# Patient Record
Sex: Female | Born: 1960 | Race: White | Smoking: Former smoker
Health system: Southern US, Community
[De-identification: ages and names within clinical notes are randomized; demographics above are authoritative.]

---

## 2015-05-29 ENCOUNTER — Ambulatory Visit (INDEPENDENT_AMBULATORY_CARE_PROVIDER_SITE_OTHER): Payer: BLUE CROSS/BLUE SHIELD | Admitting: Neurology

## 2015-05-29 ENCOUNTER — Encounter: Payer: Self-pay | Admitting: Neurology

## 2015-05-29 VITALS — BP 138/96 | HR 68 | Resp 16 | Ht 59.0 in | Wt 206.0 lb

## 2015-05-29 DIAGNOSIS — H4710 Unspecified papilledema: Secondary | ICD-10-CM | POA: Diagnosis not present

## 2015-05-29 DIAGNOSIS — R930 Abnormal findings on diagnostic imaging of skull and head, not elsewhere classified: Secondary | ICD-10-CM | POA: Diagnosis not present

## 2015-05-29 DIAGNOSIS — R9089 Other abnormal findings on diagnostic imaging of central nervous system: Secondary | ICD-10-CM

## 2015-05-29 DIAGNOSIS — G43009 Migraine without aura, not intractable, without status migrainosus: Secondary | ICD-10-CM | POA: Diagnosis not present

## 2015-05-29 MED ORDER — TOPIRAMATE 100 MG PO TABS: ORAL_TABLET | ORAL | Status: DC

## 2015-05-29 MED ORDER — SUMATRIPTAN SUCCINATE 100 MG PO TABS: 100.0000 mg | ORAL_TABLET | Freq: Once | ORAL | Status: DC | PRN

## 2015-05-29 NOTE — Patient Instructions (Signed)
Start topiramate one half pill nightly for one week then go up to 1 pill nightly. He can then stop the amitriptyline.  Somebody will call you to schedule the lumbar puncture which should be performed in the next couple weeks.

## 2015-05-29 NOTE — Progress Notes (Signed)
[facility]  PATIENT: Cheryl Gomez DOB: 27-Jan-1961  REFERRING DOCTOR OR PCP:  Leretha Pol, NP (phone [phone]); Dr. Bradd Burner (fax [phone]) SOURCE: patient, records from Ms. Harris, Dr. Bradd Burner , and MRI report  _________________________________   HISTORICAL  CHIEF COMPLAINT:  Chief Complaint  Patient presents with  . Abnormal MRI    Cheryl Gomez is here for eval of abnormal mri brain.  Sts. she went to the opthalmologist for new glasses last month, and the opthalmologist "saw somethimg" and ordered mri brain.  Sts. she was told mri brain showed changes suspicious for MS.  Sts. she has always had h/a's--has been on Amitriptyline for about a yr.  Sts. in the last 1-2 mos, h/a's have been more frequent, more severe./fim    HISTORY OF PRESENT ILLNESS:  I had the pleasure of seeing your patient, Cheryl Gomez, at [facility] for neurologic consultation regarding her optic nerve swelling and abnormal MRI.  She is a 55 year old woman with a history of migraine headaches who [facility] and migraine headaches. On examination, visual acuity was 20/25 OD and 20/20 OS. There was mild cataracts both eyes. She had optic nerve swelling bilaterally. She was referred for an MRI of the brain with gadolinium. The report states that there were 3-4 white matter foci within impression "minimal nonspecific white matter disease as described. The differential diagnosis includes microangiopathic disease, migraine headache, vasculitis, various infectious and post infectious etiologies, and demyelination (MS). There is no involvement of the corpus callosum to suggest the latter."  She has had migraine headaches times many years but they have worsened over the last year or so. When she gets a migraine headache it is usually bilateral with pain over the eyes and in the back of the head. Nausea with the migraines. Moving the  head worsens the headache. She does not get any vomiting. She reports photophobia and phonophobia. When she gets a migraine, she usually will take OTC Motrin. However, it does not always help. In the past, she took Imitrex with better benefit., She is on atenolol and amitriptyline.  The atenolol was for blood pressure and the amitriptyline was for migraine prophylaxis.  She has had problems with insomnia. At night, she often has trouble quieting her mind enough to go to bed. The amitriptyline helped a little bit but not as much recently.   She denies restless leg syndrome most nights but sometimes has right leg cramps.      She denies any difficulties with her gait. There is no weakness or numbness. She does have urinary frequency and urgency. She has nocturia up to 4 times a night. Amitriptyline did not help the nocturia.     She denies depression or anxiety.  REVIEW OF SYSTEMS: Constitutional: No fevers, chills, sweats, or change in appetite Eyes: No visual changes, double vision, eye pain Ear, nose and throat: No hearing loss, ear pain, nasal congestion, sore throat Cardiovascular: No chest pain, palpitations Respiratory: No shortness of breath at rest or with exertion.   No wheezes GastrointestinaI: No nausea, vomiting, diarrhea, abdominal pain, fecal incontinence Genitourinary: She reports urinary frequency and urgency and nocturia.. Musculoskeletal: Some neck pain Integumentary: No rash, pruritus, skin lesions Neurological: as above Psychiatric: No depression at this time.  No anxiety Endocrine: No palpitations, diaphoresis, change in appetite, change in weigh or increased thirst Hematologic/Lymphatic: No anemia, purpura, petechiae. Allergic/Immunologic: No itchy/runny eyes, nasal congestion, recent allergic reactions, rashes  ALLERGIES:  No Known Allergies  HOME MEDICATIONS:  Current outpatient prescriptions:  .  amitriptyline (ELAVIL) 25 MG tablet, , Disp: , Rfl:  .   atenolol (TENORMIN) 25 MG tablet, , Disp: , Rfl:  .  simvastatin (ZOCOR) 20 MG tablet, Take 20 mg by mouth daily., Disp: , Rfl:   PAST MEDICAL HISTORY: No past medical history on file.  PAST SURGICAL HISTORY: No past surgical history on file.  FAMILY HISTORY: No family history on file.  SOCIAL HISTORY:  Social History   Social History  . Marital Status: Unknown    Spouse Name: N/A  . Number of Children: N/A  . Years of Education: N/A   Occupational History  . Not on file.   Social History Main Topics  . Smoking status: Former Games developermoker  . Smokeless tobacco: Not on file  . Alcohol Use: 0.0 oz/week    0 Standard drinks or equivalent per week     Comment: social  . Drug Use: No  . Sexual Activity: Not on file   Other Topics Concern  . Not on file   Social History Narrative  . No narrative on file     PHYSICAL EXAM  Filed Vitals:   05/29/15 1511  BP: 138/96  Pulse: 68  Resp: 16  Height: 4\' 11"  (1.499 m)  Weight: 206 lb (93.441 kg)    Body mass index is 41.58 kg/(m^2).   General: The patient is well-developed and well-nourished and in no acute distress  Eyes:  Funduscopic exam shows mild bilateral papilledema.  Venous pulsations are not seen.   Arteries appear normal.  There are no hemorrhages  Neck: The neck is supple, no carotid bruits are noted.  The neck is slightly tender (occiput)  Cardiovascular: The heart has a regular rate and rhythm with a normal S1 and S2. There were no murmurs, gallops or rubs.    Skin: Extremities are without significant edema.  Musculoskeletal:  Back is nontender  Neurologic Exam  Mental status: The patient is alert and oriented x 3 at the time of the examination. The patient has apparent normal recent and remote memory, with an apparently normal attention span and concentration ability.   Speech is normal.  Cranial nerves: Extraocular movements are full. Pupils are equal, round, and reactive to light and accomodation.   Visual fields are full.  Facial symmetry is present. There is good facial sensation to soft touch bilaterally.Facial strength is normal.  Trapezius and sternocleidomastoid strength is normal. No dysarthria is noted.  The tongue is midline, and the patient has symmetric elevation of the soft palate. No obvious hearing deficits are noted.  Motor:  Muscle bulk is normal.   Tone is normal. Strength is  5 / 5 in all 4 extremities.   Sensory: Sensory testing is intact to pinprick, soft touch and vibration sensation in all 4 extremities.  Coordination: Cerebellar testing reveals good finger-nose-finger and heel-to-shin bilaterally.  Gait and station: Station is normal.   Gait is normal. Tandem gait is mildly wide. Romberg is negative.   Reflexes: Deep tendon reflexes are symmetric and normal bilaterally.   Plantar responses are flexor.    DIAGNOSTIC DATA (LABS, IMAGING, TESTING) - I reviewed patient records, labs, notes, testing and imaging myself where available.      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 I discussed with her that the likelihood of multiple sclerosis is very mild. By the imaging report, she has only a few nonspecific foci in the brain that is not an uncommon finding by the time summary reaches their 35s, especially with a history of hypertension and migraine.   MS lesions usually look different. Therefore, I am requesting the actual MRI images and we will call her back with an update after I have a chance to review them.  I am concerned about her mild papilledema. She does also report some visual changes and has the headaches that have worsened the past year. She reports gaining some weight over the past couple of years. Most likely, she has idiopathic intracranial  hypertension (pseudotumor cerebri). I will have her get a lumbar puncture to measure the opening pressure of the CSF. While we are getting CSF, we will also do oligoclonal bands and IgG index to assess for the possibility of MS.  She will get 3 pills of alprazolam 0.5 mg to help her through the procedure.   I will switch her from amitriptyline to topiramate as it can reduce the CSF pressure and might help her migraines before. I will also write for Imitrex for breakthrough migraine. I also would like her to get visual field testing and she can hopefully get that at her eye care center.  She will return to see me in 2 months or sooner if there are new worsening neurologic symptoms.  Thank you for asking me to see Cheryl Gomez for a neurologic consultation. Please let me know if I can be of further assistance with her or the patient's the future.   Richard A. Epimenio Foot, MD, PhD 05/29/2015, 3:14 PM Certified in Neurology, Clinical Neurophysiology, Sleep Medicine, Pain Medicine and Neuroimaging  [facility] [address] [place], [place] [address] [phone]

## 2015-06-04 ENCOUNTER — Telehealth: Payer: Self-pay | Admitting: *Deleted

## 2015-06-04 NOTE — Telephone Encounter (Signed)
[facility] requesting MRI brain/ spine on a CD.

## 2015-06-06 ENCOUNTER — Other Ambulatory Visit: Payer: Self-pay | Admitting: Internal Medicine

## 2015-06-12 ENCOUNTER — Telehealth: Payer: Self-pay | Admitting: Neurology

## 2015-06-12 NOTE — Telephone Encounter (Signed)
I have spoken with Angeleen this morning and per RAS, advised that he has reviewed her mri--that there are some spots in her brain that are likely due to a combination of age, migraines, htn; they do not look like typical ms lesions.  She verbalized understanding of same.  We will call her with results of lp labs a few days after lp/fim

## 2015-06-12 NOTE — Telephone Encounter (Signed)
LMTC./fim 

## 2015-06-12 NOTE — Telephone Encounter (Signed)
I personally reviewed her MRI images from 04/29/2015 she has a few small spots in the brain. Most likely, they are due to a combination of age, migraine and hypertension.   They do not have an appearance typical for MS.   A few days after the lumbar puncture, we will get back to her with those results.

## 2015-06-16 ENCOUNTER — Other Ambulatory Visit: Payer: Self-pay | Admitting: Neurology

## 2015-06-16 ENCOUNTER — Ambulatory Visit
Admission: RE | Admit: 2015-06-16 | Discharge: 2015-06-16 | Disposition: A | Discharge status: Home / Self Care | Payer: BLUE CROSS/BLUE SHIELD | Source: Ambulatory Visit | Attending: Neurology | Admitting: Neurology

## 2015-06-16 DIAGNOSIS — H4710 Unspecified papilledema: Secondary | ICD-10-CM

## 2015-06-16 DIAGNOSIS — G43009 Migraine without aura, not intractable, without status migrainosus: Secondary | ICD-10-CM

## 2015-06-16 DIAGNOSIS — R9089 Other abnormal findings on diagnostic imaging of central nervous system: Secondary | ICD-10-CM

## 2015-06-16 LAB — CSF CELL COUNT WITH DIFFERENTIAL
Eosinophils, CSF: 0 % (ref 0–1)
LYMPHS CSF: 75 % (ref 40–80)
Monocyte/Macrophage: 25 % (ref 15–45)
RBC Count, CSF: 1 cu mm — ABNORMAL HIGH
SEGMENTED NEUTROPHILS-CSF: 0 % (ref 0–6)
TUBE #: 3
WBC CSF: 4 uL (ref 0–5)

## 2015-06-16 LAB — GLUCOSE, CSF: Glucose, CSF: 59 mg/dL (ref 43–76)

## 2015-06-16 NOTE — Discharge Instructions (Signed)

## 2015-06-16 NOTE — Progress Notes (Addendum)
1 SST tube drawn from right AC to go with spinal fluid. Site is unremarkable and pt tolerated procedure well.  Discharge instructions explained to pt and her friend JKL RN

## 2015-06-19 LAB — VDRL, CSF: VDRL Quant, CSF: NONREACTIVE

## 2015-06-19 LAB — CNS IGG SYNTHESIS RATE, CSF+BLOOD
ALBUMIN CSF: 18 mg/dL (ref 8.0–42.0)
ALBUMIN, SERUM(NEPH): 3.8 g/dL (ref 3.5–4.9)
IGG INDEX, CSF: 0.43 (ref <0.66)
IGG, SERUM: 1030 mg/dL (ref 694–1618)
IgG, CSF: 2.1 mg/dL (ref 0.8–7.7)
MS CNS IGG SYNTHESIS RATE: -4.3 mg/(24.h) (ref <=3.3)

## 2015-06-19 LAB — ANGIOTENSIN CONVERTING ENZYME, CSF: ACE, CSF: 11 U/L (ref <=15)

## 2015-06-22 LAB — OLIGOCLONAL BANDS, CSF + SERM

## 2015-06-23 ENCOUNTER — Telehealth: Payer: Self-pay | Admitting: *Deleted

## 2015-06-23 ENCOUNTER — Other Ambulatory Visit: Payer: Self-pay | Admitting: Neurology

## 2015-06-23 ENCOUNTER — Telehealth: Payer: Self-pay | Admitting: Neurology

## 2015-06-23 MED ORDER — ACETAZOLAMIDE ER 500 MG PO CP12: 500.0000 mg | ORAL_CAPSULE | Freq: Two times a day (BID) | ORAL | Status: DC

## 2015-06-23 NOTE — Telephone Encounter (Signed)
-----   Message from Richard A [name], MD sent at 06/20/2015  5:00 PM EST ----- Please let her know that the LP did show elevated pressure (c/w Pseudotumor cerebri)  please call in Diamox ER 500 mg po bid   #60   #11  The CSF results are not in EMR, please see if we can get ([facility]?) 

## 2015-06-23 NOTE — Telephone Encounter (Signed)
noted/fim 

## 2015-06-23 NOTE — Telephone Encounter (Signed)
I have spoken with Cheryl Gomez and per RAS, advised that LP showed elevated csf pressure, consistent with Pseudotumor Cerebri; that he would like her to start Diamox ER  po bid.  She verbalized understanding of same.  Rx. escribed to [facility] in [place].  LP results received from [facility] and in RAS box to be reviewed/fim

## 2015-06-23 NOTE — Telephone Encounter (Signed)
I have spoken with Krystalynn this morning and per RAS, advised that csf pressure was high; consistent with pseudotumor cerebri; that RAS would like for her to start Diamox ER  po bid.  She verbalized understanding of same, is agreeable with this plan.  Rx. escribed to [facility] in [place] per her request.  I have spoken with Rayfield Citizen at [facility] and requested LP lab results/fim

## 2015-06-23 NOTE — Telephone Encounter (Signed)
Cheryl Gomez with GI called said labs would be faxed from [facility] to [phone] per Faith request

## 2015-06-23 NOTE — Telephone Encounter (Signed)
LMTC./fim 

## 2015-06-23 NOTE — Telephone Encounter (Signed)
-----   Message from Asa Lente, MD sent at 06/20/2015  5:00 PM EST ----- Please let her know that the LP did show elevated pressure (c/w Pseudotumor cerebri)  please call in Diamox ER 500 mg po bid   #60   #11  The CSF results are not in EMR, please see if we can get ([facility]?)

## 2015-06-23 NOTE — Telephone Encounter (Signed)
LP results  Opening pressure was elevated at 27 cm Studies were otherwise normal: Cell count, glucose, VDRL, ACE were normal. IgG index was normal (0.43). There were no oligoclonal bands.

## 2015-07-28 ENCOUNTER — Encounter: Payer: Self-pay | Admitting: Neurology

## 2015-07-28 ENCOUNTER — Telehealth: Payer: Self-pay | Admitting: Neurology

## 2015-07-28 ENCOUNTER — Ambulatory Visit (INDEPENDENT_AMBULATORY_CARE_PROVIDER_SITE_OTHER): Payer: BLUE CROSS/BLUE SHIELD | Admitting: Neurology

## 2015-07-28 VITALS — BP 164/96 | HR 66 | Resp 16 | Ht 59.0 in | Wt 201.2 lb

## 2015-07-28 DIAGNOSIS — R930 Abnormal findings on diagnostic imaging of skull and head, not elsewhere classified: Secondary | ICD-10-CM | POA: Diagnosis not present

## 2015-07-28 DIAGNOSIS — H4710 Unspecified papilledema: Secondary | ICD-10-CM | POA: Diagnosis not present

## 2015-07-28 DIAGNOSIS — G43009 Migraine without aura, not intractable, without status migrainosus: Secondary | ICD-10-CM

## 2015-07-28 DIAGNOSIS — R9089 Other abnormal findings on diagnostic imaging of central nervous system: Secondary | ICD-10-CM

## 2015-07-28 MED ORDER — ACETAZOLAMIDE 250 MG PO TABS: 500.0000 mg | ORAL_TABLET | Freq: Two times a day (BID) | ORAL | Status: DC

## 2015-07-28 MED ORDER — AMITRIPTYLINE HCL 25 MG PO TABS: 25.0000 mg | ORAL_TABLET | Freq: Every day | ORAL | Status: DC

## 2015-07-28 NOTE — Telephone Encounter (Signed)
Patient is calling and states that since she has been taking Rx acetazolamide 500 mg 2 X day she has been experiencing side affects. Her hands and fingers are tingling and gettng worse every day. Her eyes are blurry and she is having trouble focusing sometimes, also, really bad headaches.  Please call.

## 2015-07-28 NOTE — Telephone Encounter (Signed)
I have spoken with Cheryl Gomez this afternoon.  She c/o worsening vision, h/a, new sx. of tingling in hands.  Appt. given with RAS this afternoon--[place] must get someone to bring her-- ok to be seen any time between 2-3pm as RAS is reading mri's this afternoon.  It is understood that she may be some late for a 1440 appt/fim

## 2015-07-28 NOTE — Progress Notes (Signed)
[facility]  PATIENT: Cheryl Gomez DOB: Mar 07, 1961  REFERRING DOCTOR OR PCP:  Leretha Pol, NP (phone [phone]); Dr. Bradd Burner (fax [phone]) SOURCE: patient, records from Ms. Harris, Dr. Bradd Burner , and MRI report  _________________________________   HISTORICAL  CHIEF COMPLAINT:  Chief Complaint  Patient presents with  . papilledema    Sts. despite Diamox and Topamax, h/a's and vision are worse, and she now has c/o tingling in both hands, onset 2 weeks ago and getting worse./fim    HISTORY OF PRESENT ILLNESS:  Cheryl Gomez is a 55 yo woman with optic nerve swelling and abnormal MRI.     She underwent a lumbar puncture 06/16/15 showing elevated opening pressure of 270 mm.   IgG index was normal (0.43).   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   I personally reviewed the MRI from 04/29/2015. There are several T2/FLAIR hyperintense foci, predominantly in the subcortical and deep white matter.   They are nonspecific. MS is unlikely.  Headaches were better for a couple weeks after the lumbar puncture.    She is experiencing more headaches again.  Over the past several weeks, she has felt her ability to focus is worse.   She also has noticed a lot of tingling, especially in her hands.  Migraine:   She has had migraine headaches for many years.   Headaches worsened over the last half of 2016. When she gets a migraine headache it is usually bilateral with pain over the eyes and in the back of the head. She has nausea but no vomiting with the migraines. Moving the head worsens the headache.   She reports photophobia and phonophobia. When she gets a migraine, she usually will take OTC Motrin. However, it does not always help. In the past, she took Imitrex with better benefit., She is on atenolol and amitriptyline.  The atenolol was for blood pressure and the amitriptyline was for migraine prophylaxis.  Insomnia:     She has had problems with insomnia. At night, she often has trouble quieting her mind enough to go to bed. The amitriptyline helped a little bit but not as much recently.   She denies restless leg syndrome most nights but sometimes has right leg cramps.      History:    In 04/2015, she [facility] ([facility] for decreased vision and migraine headaches. On examination, visual acuity was 20/25 OD and 20/20 OS. There was mild cataracts both eyes. She had optic nerve swelling bilaterally. She was referred for an MRI of the brain with gadolinium. The report states that there were 3-4 white matter foci within impression "minimal nonspecific white matter disease as described. The differential diagnosis includes microangiopathic disease, migraine headache, vasculitis, various infectious and post infectious etiologies, and demyelination (MS). There is no involvement of the corpus callosum to suggest the latter."  She was referred to me for further evaluation.   Lumbar puncture showed elevated opening pressure.   REVIEW OF SYSTEMS: Constitutional: No fevers, chills, sweats, or change in appetite Eyes: No visual changes, double vision, eye pain Ear, nose and throat: No hearing loss, ear pain, nasal congestion, sore throat Cardiovascular: No chest pain, palpitations Respiratory: No shortness of breath at rest or with exertion.   No wheezes GastrointestinaI: No nausea, vomiting, diarrhea, abdominal pain, fecal incontinence Genitourinary: She reports urinary frequency and urgency and nocturia.. Musculoskeletal: Some neck pain Integumentary: No rash,  pruritus, skin lesions Neurological: as above Psychiatric: No depression at this time.  No anxiety Endocrine: No palpitations, diaphoresis, change in appetite, change in weigh or increased thirst Hematologic/Lymphatic: No anemia, purpura, petechiae. Allergic/Immunologic: No itchy/runny eyes, nasal congestion, recent allergic reactions,  rashes  ALLERGIES: No Known Allergies  HOME MEDICATIONS:  Current outpatient prescriptions:  .  acetaZOLAMIDE (DIAMOX) 500 MG capsule, Take 1 capsule (500 mg total) by mouth 2 (two) times daily., Disp: 60 capsule, Rfl: 11 .  amitriptyline (ELAVIL) 25 MG tablet, , Disp: , Rfl:  .  atenolol (TENORMIN) 25 MG tablet, , Disp: , Rfl:  .  simvastatin (ZOCOR) 20 MG tablet, Take 20 mg by mouth daily., Disp: , Rfl:  .  SUMAtriptan (IMITREX) 100 MG tablet, Take 1 tablet (100 mg total) by mouth once as needed for migraine. May repeat in 2 hours if headache persists or recurs., Disp: 10 tablet, Rfl: 5 .  topiramate (TOPAMAX) 100 MG tablet, 1/2 pill qHS x 1 week, then 1 po pHS, Disp: 30 tablet, Rfl: 3  PAST MEDICAL HISTORY: History reviewed. No pertinent past medical history.  PAST SURGICAL HISTORY: History reviewed. No pertinent past surgical history.  FAMILY HISTORY: Family History  Problem Relation Age of Onset  . Multiple sclerosis Mother   . Heart disease Mother   . Lung cancer Father   . Seizures Brother   . Hypertension Brother   . High Cholesterol Brother     SOCIAL HISTORY:  Social History   Social History  . Marital Status: Unknown    Spouse Name: N/A  . Number of Children: N/A  . Years of Education: N/A   Occupational History  . Not on file.   Social History Main Topics  . Smoking status: Former Games developermoker  . Smokeless tobacco: Not on file  . Alcohol Use: 0.0 oz/week    0 Standard drinks or equivalent per week     Comment: social  . Drug Use: No  . Sexual Activity: Not on file   Other Topics Concern  . Not on file   Social History Narrative     PHYSICAL EXAM  Filed Vitals:   07/28/15 1435  BP: 164/96  Pulse: 66  Resp: 16  Height: 4\' 11"  (1.499 m)  Weight: 201 lb 3.2 oz (91.264 kg)    Body mass index is 40.62 kg/(m^2).   General: The patient is well-developed and well-nourished and in no acute distress  Eyes:  Funduscopic exam shows borderline  bilateral papilledema (mild temporal blurring of disc margin).  Venous pulsations are not clearly seen.   Arteries appear normal.  There are no hemorrhages  Neurologic Exam  Mental status: The patient is alert and oriented x 3 at the time of the examination. The patient has apparent normal recent and remote memory, with an apparently normal attention span and concentration ability.   Speech is normal.  Cranial nerves: Extraocular movements are full. Pupils are equal, round, and reactive to light and accomodation.  Visual fields are full.   There is good facial sensation to soft touch bilaterally.Facial strength is normal.  Trapezius and sternocleidomastoid strength is normal. No dysarthria is noted.  The tongue is midline, and the patient has symmetric elevation of the soft palate. No obvious hearing deficits are noted.  Motor:  Muscle bulk is normal.   Tone is normal. Strength is  5 / 5 in all 4 extremities.   Sensory: Sensory testing is intact to touch and vibration sensation in all 4 extremities.  Coordination: Cerebellar testing reveals good finger-nose-finger and heel-to-shin bilaterally.  Gait and station: Station is normal.   Gait is normal. Tandem gait is mildly wide. Romberg is negative.   Reflexes: Deep tendon reflexes are symmetric and normal bilaterally.    DIAGNOSTIC DATA (LABS, IMAGING, TESTING) - I reviewed patient records, labs, notes, testing and imaging myself where available.      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   She has some trouble swallowing and large acetazolamide pills and I will switch her to a lower dose and keep the total daily dose the same. 2.   She will discontinue topiramate as the combination of Diamox and Topamax is likely causing her cognitive symptoms and the dysesthesias. 3.   I have advised her to follow up with her ophthalmologist or optometrist to be reevaluated  to see if the papilledema is improving and also to get visual field testing 4.   We discussed that if she does not improve on acetazolamide, that I will refer her to neurosurgery to have a shunt placed. She will return to see me in 3 months or sooner if there are new or worsening neurologic symptoms.  Elanor Cale A. Epimenio Foot, MD, PhD 07/28/2015, 2:38 PM Certified in Neurology, Clinical Neurophysiology, Sleep Medicine, Pain Medicine and Neuroimaging  [facility] [address] [place], [place] [address] [phone]

## 2015-07-28 NOTE — Patient Instructions (Addendum)
We are going to stop the large acetazolamide pill that is hard to swallows and instead place you on a smaller pill.   You will take two 250 mg acetazolamide pills twice a day for a total of 4 pills a day. That [facility].      You will stop the topiramate  Continue amitriptyline  Continue to take sumatriptan when you get a bad migraine  I also would like you to make an appointment to see your eye doctor again for a follow-up.   Besides and examination, I would like you to get visual field testing

## 2015-07-30 ENCOUNTER — Telehealth: Payer: Self-pay | Admitting: *Deleted

## 2015-07-30 NOTE — Telephone Encounter (Signed)
Patient [employer] form on Michelle desk. 

## 2015-07-30 NOTE — Telephone Encounter (Signed)
Patient Cheryl ParishS[employer] form on [employer] desk.

## 2015-08-01 ENCOUNTER — Encounter: Payer: Self-pay | Admitting: *Deleted

## 2015-08-01 ENCOUNTER — Telehealth: Payer: Self-pay | Admitting: *Deleted

## 2015-08-01 NOTE — Telephone Encounter (Signed)
I have spoken with Gavin PoundDeborah and advised that I have faxed a note to [facility] Attn: Shanda BumpsJessica in Personnel fax # [phone].  Note sts. pt. may return to work without restrictions/fim

## 2015-08-01 NOTE — Telephone Encounter (Signed)
Pt called requesting to speak with Faith. She did not go into detail

## 2015-08-01 NOTE — Telephone Encounter (Signed)
LMTC.  I need to speak with her regarding fmla paperwork/fim

## 2015-08-01 NOTE — Telephone Encounter (Signed)
FMLA paperwork completed.  F[facility]ed back [facility] [employer] [facility] [facility] fax # [phone].  Copy mailed to pt. for her records.  Copy sent to be scanned into emr/fim

## 2015-08-04 ENCOUNTER — Ambulatory Visit: Payer: BLUE CROSS/BLUE SHIELD | Admitting: Neurology

## 2015-10-28 ENCOUNTER — Ambulatory Visit (INDEPENDENT_AMBULATORY_CARE_PROVIDER_SITE_OTHER): Payer: BLUE CROSS/BLUE SHIELD | Admitting: Neurology

## 2015-10-28 ENCOUNTER — Encounter: Payer: Self-pay | Admitting: Neurology

## 2015-10-28 VITALS — BP 150/92 | HR 80 | Resp 16 | Ht 59.0 in | Wt 202.5 lb

## 2015-10-28 DIAGNOSIS — G43009 Migraine without aura, not intractable, without status migrainosus: Secondary | ICD-10-CM | POA: Diagnosis not present

## 2015-10-28 DIAGNOSIS — R9089 Other abnormal findings on diagnostic imaging of central nervous system: Secondary | ICD-10-CM

## 2015-10-28 DIAGNOSIS — R930 Abnormal findings on diagnostic imaging of skull and head, not elsewhere classified: Secondary | ICD-10-CM | POA: Diagnosis not present

## 2015-10-28 DIAGNOSIS — G932 Benign intracranial hypertension: Secondary | ICD-10-CM | POA: Diagnosis not present

## 2015-10-28 DIAGNOSIS — H4710 Unspecified papilledema: Secondary | ICD-10-CM | POA: Diagnosis not present

## 2015-10-28 MED ORDER — AMITRIPTYLINE HCL 25 MG PO TABS: 25.0000 mg | ORAL_TABLET | Freq: Every day | ORAL | Status: DC

## 2015-10-28 MED ORDER — SUMATRIPTAN SUCCINATE 100 MG PO TABS: 100.0000 mg | ORAL_TABLET | Freq: Once | ORAL | Status: DC | PRN

## 2015-10-28 NOTE — Progress Notes (Signed)
[facility]  PATIENT: Cheryl Gomez DOB: [birth year]  REFERRING DOCTOR OR PCP:  Leretha Pol, NP (phone [phone]); Dr. Bradd Burner (fax [phone]) SOURCE: patient, records from Ms. Harris, Dr. Bradd Burner , and MRI report  _________________________________   HISTORICAL  CHIEF COMPLAINT:  Chief Complaint  Patient presents with  . Pappiledema    Sts. h/a's are less frequent, same severity since last ov.  Sts. she is taking Diamox as rx'd/fim  . Migraines    HISTORY OF PRESENT ILLNESS:  Cheryl Gomez is a 55 yo woman with optic nerve swelling and abnormal MRI.      She feels that the headaches have done better the last couple months while on Diamox.  She has had 2 severe headaches since her last visit, treated with Imitrex.   Daily headache has resolved.   She gets some tingling in her hands but less than she did initially. She is on Diamox 500 mg (2 x 250) bid.     Idiopathic intracranial hypertension:   She underwent a lumbar puncture 06/16/15 showing elevated opening pressure of 270 mm.   IgG index was normal (0.43).   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   MRI from 04/29/2015. There are several T2/FLAIR hyperintense foci, predominantly in the subcortical and deep white matter.   They are nonspecific. MS is unlikely.  Migraine:   She has had migraine headaches for many years.   She feels back to her more typical migraine frequency again. When she gets a migraine headache it is usually bilateral with pain over the eyes and in the back of the head. She has nausea and moving the head worsens the headache.   She reports photophobia and phonophobia when HA is severe.. When she gets a migraine, she usually will take Imitrex with benefit.   Sometimes she takes a second one 2 hours later, She is on atenolol and amitriptyline.  The atenolol was for blood pressure and the amitriptyline was for migraine prophylaxis.  Insomnia:     She is sleeping better with amitriptyline.   She is staying asleep better.   She denies restless leg syndrome most nights but sometimes has right leg cramps.      History:    In 04/2015, she saw Dr. Bradd Burner ([facility]) for decreased vision and migraine headaches. On examination, visual acuity was 20/25 OD and 20/20 OS. There was mild cataracts both eyes. She had optic nerve swelling bilaterally. She was referred for an MRI of the brain with gadolinium. The report states that there were 3-4 white matter foci within impression "minimal nonspecific white matter disease as described. The differential diagnosis includes microangiopathic disease, migraine headache, vasculitis, various infectious and post infectious etiologies, and demyelination (MS). There is no involvement of the corpus callosum to suggest the latter."  She was referred to me for further evaluation.   Lumbar puncture showed elevated opening pressure.   REVIEW OF SYSTEMS: Constitutional: No fevers, chills, sweats, or change in appetite.    She is sleeping better.   Eyes: No visual changes, double vision, eye pain Ear, nose and throat: No hearing loss, ear pain, nasal congestion, sore throat Cardiovascular: No chest pain, palpitations Respiratory: No shortness of breath at rest or with exertion.   No wheezes GastrointestinaI: No nausea, vomiting, diarrhea, abdominal pain, fecal incontinence Genitourinary: She reports urinary frequency and urgency and nocturia.. Musculoskeletal: Some neck pain Integumentary: No rash,  pruritus, skin lesions Neurological: as above Psychiatric: No depression at this time.  No anxiety Endocrine: No palpitations, diaphoresis, change in appetite, change in weigh or increased thirst Hematologic/Lymphatic: No anemia, purpura, petechiae. Allergic/Immunologic: No itchy/runny eyes, nasal congestion, recent allergic reactions, rashes  ALLERGIES: No Known Allergies  HOME MEDICATIONS:  Current  outpatient prescriptions:  .  acetaZOLAMIDE (DIAMOX) 250 MG tablet, Take 2 tablets (500 mg total) by mouth 2 (two) times daily., Disp: 120 tablet, Rfl: 11 .  amitriptyline (ELAVIL) 25 MG tablet, Take 1 tablet (25 mg total) by mouth at bedtime., Disp: 30 tablet, Rfl: 5 .  atenolol (TENORMIN) 25 MG tablet, , Disp: , Rfl:  .  simvastatin (ZOCOR) 20 MG tablet, Take 20 mg by mouth daily., Disp: , Rfl:  .  SUMAtriptan (IMITREX) 100 MG tablet, Take 1 tablet (100 mg total) by mouth once as needed for migraine. May repeat in 2 hours if headache persists or recurs., Disp: 10 tablet, Rfl: 5  PAST MEDICAL HISTORY: History reviewed. No pertinent past medical history.  PAST SURGICAL HISTORY: History reviewed. No pertinent past surgical history.  FAMILY HISTORY: Family History  Problem Relation Age of Onset  . Multiple sclerosis Mother   . Heart disease Mother   . Lung cancer Father   . Seizures Brother   . Hypertension Brother   . High Cholesterol Brother     SOCIAL HISTORY:  Social History   Social History  . Marital Status: Unknown    Spouse Name: N/A  . Number of Children: N/A  . Years of Education: N/A   Occupational History  . Not on file.   Social History Main Topics  . Smoking status: Former Games developer  . Smokeless tobacco: Not on file  . Alcohol Use: 0.0 oz/week    0 Standard drinks or equivalent per week     Comment: social  . Drug Use: No  . Sexual Activity: Not on file   Other Topics Concern  . Not on file   Social History Narrative     PHYSICAL EXAM  Filed Vitals:   10/28/15 1429  BP: 150/92  Pulse: 80  Resp: 16  Height:  (1.499 m)  Weight: 202 lb 8 oz (91.853 kg)    Body mass index is 40.88 kg/(m^2).   General: The patient is well-developed and well-nourished and in no acute distress  Eyes:  Funduscopic exam shows borderline papilledema OD (mild temporal blurring of disc margin) but OS looks normal..  Venous pulsations are seen.   Arteries appear  normal.  There are no hemorrhages  Neurologic Exam  Mental status: The patient is alert and oriented x 3 at the time of the examination. The patient has apparent normal recent and remote memory, with an apparently normal attention span and concentration ability.   Speech is normal.  Cranial nerves: Extraocular movements are full. Pupils are equal, round, and reactive to light and accomodation.     There is good facial sensation to soft touch bilaterally.Facial strength is normal.  Trapezius and sternocleidomastoid strength is normal. No dysarthria is noted.  The tongue is midline, and the patient has symmetric elevation of the soft palate. No obvious hearing deficits are noted.  Motor:  Muscle bulk is normal.   Tone is normal. Strength is  5 / 5 in all 4 extremities.   Sensory: Sensory testing is intact to touch and vibration sensation in all 4 extremities.  Coordination: Cerebellar testing reveals good finger-nose-finger and heel-to-shin bilaterally.  Gait and station:  Station is normal.   Gait is normal. Tandem gait is mildly wide. Romberg is negative.   Reflexes: Deep tendon reflexes are symmetric and normal bilaterally.     DIAGNOSTIC DATA (LABS, IMAGING, TESTING) - I reviewed patient records, labs, notes, testing and imaging myself where available.      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   Continue actazolamide 250 mg x 2 twice daily (1000 mg total / day) 2.   I have advised her to follow up with her ophthalmologist or optometrist regularly.   3   We discussed that if she worsens despite on acetazolamide, that I will refer her to neurosurgery to have a shunt placed. 4.   We discussed rtying to exercise and eat better to lose weight. She will return to see me in 12 months or sooner if there are new or worsening neurologic symptoms.  Richard A. Epimenio FootSater, MD,  PhD 10/28/2015, 2:40 PM Certified in Neurology, Clinical Neurophysiology, Sleep Medicine, Pain Medicine and Neuroimaging  [facility] [address], [address] [place], [place] [address] [phone]

## 2016-06-02 ENCOUNTER — Telehealth: Payer: Self-pay | Admitting: Neurology

## 2016-06-02 NOTE — Telephone Encounter (Signed)
Cheryl Searce NP/[facility] [place] [phone] called to advise the pt has lost her insurance and diamox will cost $200/mth,is there a substitute that won't cost a lot.

## 2016-06-02 NOTE — Telephone Encounter (Signed)
LMOM for Gavin PoundDeborah to call.  I have spoken with Phyliss Searce, and per RAS, advised ok for pt. to switch to Acetazolamide ER 500mg  one po bid.  [facility]. She verbalized understanding of same, sts. she is helping pt. apply for med. assistance, so hopefully will only have short term difficulty getting meds.  Will confirm [facility] and send new rx. in if she is agreeable/fim

## 2016-06-03 MED ORDER — ACETAZOLAMIDE ER 500 MG PO CP12: 500.0000 mg | ORAL_CAPSULE | Freq: Two times a day (BID) | ORAL | 11 refills | Status: DC

## 2016-06-03 NOTE — Telephone Encounter (Signed)
I have spoken with Gavin PoundDeborah this morning and per RAS, offered to switch Acetazolamide to the ER version--this will save her a little money.  She is agreeable.  [facility]/fim

## 2016-06-03 NOTE — Telephone Encounter (Signed)
Pt's opthalmologist. Dr. Sherrine MaplesJohn Crews, called with report that pt. was in for follow up today, and he noted no papilledema, and "optic nerves look great."  I have spoken with Gavin PoundDeborah this afternoon and per RAS, advised ok to decrease Acetazolamide ER 500mg  to once daily; call if sx. worsen again.  She verbalized understanding of same/fim

## 2016-06-03 NOTE — Addendum Note (Signed)
Addended by: Candis SchatzMISENHEIMER, Tailor Lucking I on: 06/03/2016 11:33 AM   Modules accepted: Orders

## 2016-06-03 NOTE — Telephone Encounter (Signed)
Patient is returning your call and can be reached today before 9am or after 11am.

## 2016-10-27 ENCOUNTER — Ambulatory Visit: Payer: BLUE CROSS/BLUE SHIELD | Admitting: Neurology

## 2016-10-28 ENCOUNTER — Encounter: Payer: Self-pay | Admitting: Neurology

## 2016-11-15 ENCOUNTER — Other Ambulatory Visit: Payer: Self-pay | Admitting: Neurology

## 2016-11-17 ENCOUNTER — Other Ambulatory Visit: Payer: Self-pay | Admitting: Neurology

## 2016-11-18 ENCOUNTER — Other Ambulatory Visit: Payer: Self-pay | Admitting: Neurology

## 2016-11-26 ENCOUNTER — Other Ambulatory Visit: Payer: Self-pay | Admitting: Neurology

## 2017-08-14 IMAGING — XA DG FLUORO GUIDE LUMBAR PUNCTURE
1 series · 1 of 1 positions shown · non-contrast
Comparison: none

CLINICAL DATA: Papilledema.  Headache.

[Series 1: ortho standard · 1 of 1 slices shown]
[im 1/1]
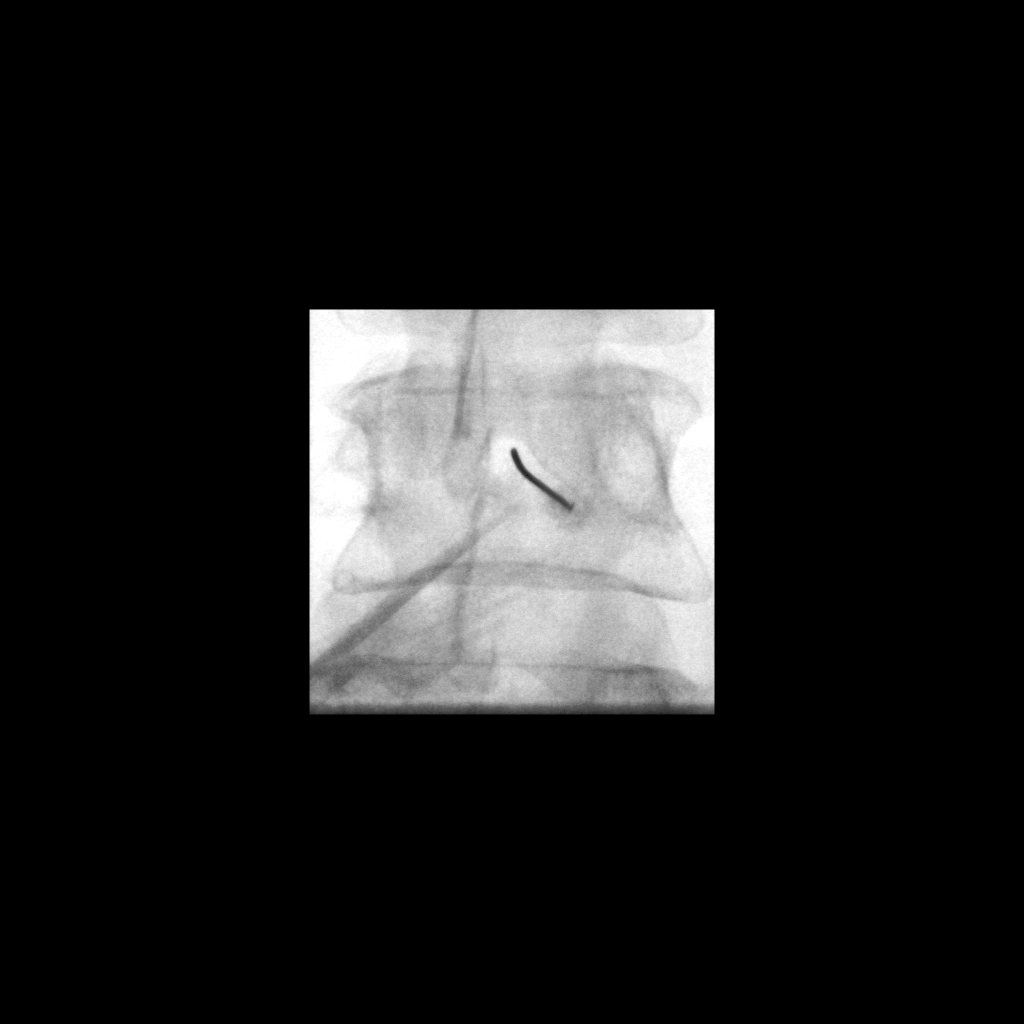

[1 of 1 positions shown; findings below may reference images not displayed]

EXAM:
DIAGNOSTIC LUMBAR PUNCTURE UNDER FLUOROSCOPIC GUIDANCE

FLUOROSCOPY TIME:  Radiation Exposure Index (as provided by the
fluoroscopic device): 0 minutes 20 seconds. 65.14 micro gray meter
squared

PROCEDURE:
Informed consent was obtained from the patient prior to the
procedure, including potential complications of headache, allergy,
and pain. With the patient prone, the lower back was prepped with
Betadine. 1% Lidocaine was used for local anesthesia. Lumbar
puncture was performed at the right L3-4 level using a 6 inch 20
gauge needle with return of clear CSF with an opening pressure of 27
cm water. Opening pressure was measured in the lateral decubitus
position. 11 ml of CSF were obtained for laboratory studies. The
patient tolerated the procedure well and there were no apparent
complications. Closing pressure was 12 cm water.
IMPRESSION: Elevated opening pressure of 27 cm water, measured in the lateral
decubitus position. 11 cc collected for requested laboratory
studies. Closing pressure 12 cm water.

## 2018-10-05 ENCOUNTER — Telehealth: Payer: Self-pay | Admitting: Neurology

## 2018-10-05 NOTE — Telephone Encounter (Signed)
Called Patient x2 and left message for her to call me back.  Called  Edson Snowball Vicks (Other)  Showing 1 of 1   [phone]       and spoke to her she will have patient call me.

## 2018-10-06 ENCOUNTER — Ambulatory Visit: Payer: BLUE CROSS/BLUE SHIELD | Admitting: Neurology

## 2018-11-22 ENCOUNTER — Encounter: Payer: Self-pay | Admitting: Neurology

## 2018-11-22 ENCOUNTER — Ambulatory Visit (INDEPENDENT_AMBULATORY_CARE_PROVIDER_SITE_OTHER): Payer: BC Managed Care – PPO | Admitting: Neurology

## 2018-11-22 ENCOUNTER — Other Ambulatory Visit: Payer: Self-pay

## 2018-11-22 VITALS — BP 121/85 | HR 72 | Temp 98.0°F | Ht 59.0 in | Wt 216.0 lb

## 2018-11-22 DIAGNOSIS — G43009 Migraine without aura, not intractable, without status migrainosus: Secondary | ICD-10-CM

## 2018-11-22 DIAGNOSIS — G932 Benign intracranial hypertension: Secondary | ICD-10-CM

## 2018-11-22 DIAGNOSIS — H4710 Unspecified papilledema: Secondary | ICD-10-CM

## 2018-11-22 MED ORDER — ACETAZOLAMIDE ER 500 MG PO CP12: 500.0000 mg | ORAL_CAPSULE | Freq: Two times a day (BID) | ORAL | 3 refills | Status: DC

## 2018-11-22 MED ORDER — AMITRIPTYLINE HCL 50 MG PO TABS: 50.0000 mg | ORAL_TABLET | Freq: Every day | ORAL | 3 refills | Status: DC

## 2018-11-22 NOTE — Progress Notes (Signed)
[facility]  PATIENT: Cheryl Gomez DOB: 07/12/1960  REFERRING DOCTOR OR PCP:  Leretha PolElizabeth Harris, NP SOURCE: patient, records from Ms. Harris, Dr. Bradd BurnerHaria , and MRI report  _________________________________   HISTORICAL  CHIEF COMPLAINT:  Chief Complaint  Patient presents with  . Follow-up    RM 12, alone. Last seen 10/28/2015. She is having headaches/pressure behind eyes. Just received new glasses.    HISTORY OF PRESENT ILLNESS:  I had the pleasure of [facility] your patient, [facility]yl Gomez[facility] neurolog[facility] concerning her papilledema and idiopathic intracranial hypertension.  She is a 58 yo woman who was diagnosed with idiopathic intracranial hypertension after presenting with papilledema.    I had previously seen her > 3 years ago, last time in June 2017.    In January 2017, she had was found to have papilledema on optometric evaluation. At that time, she had mild blurry vision.    Lumbar puncture showed an opening pressure of 27 cm.      She was placed her on Diamox 500 mg po bid.   Papilledema improved and she did not report any more visual symptoms.  Since that time, she feels that her visual symptoms are unchanged.  She has noted some visual changes and recently got stronger glasses.   She did not get visual field testing at her last eye exam.    [place] [facility] [place] (Dr. Bradd BurnerHaria) and also has seen [facility] in [place] for visual field testing in the past.    She also has a long history of migraine headaches.  Headaches are typical for migraine with photophobia, phonophobia and nausea.  Moving worsens the pain.   These improved after starting amitriptyline.  These now occur only one time a month and she takes Imitrex.  Currently, she feels her symptoms are stable.    She has occasional mild headaches with a pressure sensation.     Additionally when I first saw her she had had an MRI of the brain showing  a few white matter foci though they were not typical for MS.  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  She has sleep onset and sleep maintenance insomnia.    Amitriptyline helped her sleep initially, but less so more recently.  Even a higher dose of 50 mg has not helped the insomnia enough.    She has woken up gasping for air a few times.   She thinks she snores some.    She is sometimes sleepy during the day.  EPWORTH SLEEPINESS SCALE  On a scale of 0 - 3 what is the chance of dozing:  Sitting and Reading:   0 Watching TV:    2 Sitting inactive in a public place: 0 Passenger in car for one hour: 0 Lying down to rest in the afternoon: 2 Sitting and talking to someone: 0 Sitting quietly after lunch:  2 In a car, stopped in traffic:  0  Total (out of 24):    6/24 (normal)      REVIEW OF SYSTEMS: Constitutional: No fevers, chills, sweats, or change in appetite.    Insomnia.   Eyes: No visual changes, double vision, eye pain Ear, nose and throat: No hearing loss, ear pain, nasal congestion, sore throat Cardiovascular: No chest pain, palpitations Respiratory: No shortness of breath at rest or with exertion.  No wheezes GastrointestinaI: No nausea, vomiting, diarrhea, abdominal pain, fecal incontinence Genitourinary: She reports urinary frequency and urgency and nocturia.. Musculoskeletal: Some neck pain Integumentary: No rash, pruritus, skin lesions Neurological: as above Psychiatric: No depression at this time.  No anxiety Endocrine: No palpitations, diaphoresis, change in appetite, change in weigh or increased thirst Hematologic/Lymphatic: No anemia, purpura, petechiae. Allergic/Immunologic: No itchy/runny eyes, nasal congestion, recent allergic reactions, rashes  ALLERGIES: No Known Allergies  HOME MEDICATIONS:  Current Outpatient  Medications:  .  acetaZOLAMIDE (DIAMOX) 500 MG capsule, Take 1 capsule (500 mg total) by mouth 2 (two) times daily., Disp: 180 capsule, Rfl: 3 .  amitriptyline (ELAVIL) 50 MG tablet, Take 1 tablet (50 mg total) by mouth at bedtime., Disp: 90 tablet, Rfl: 3 .  atenolol (TENORMIN) 25 MG tablet, Take 25 mg by mouth daily., Disp: , Rfl:  .  hydrochlorothiazide (HYDRODIURIL) 12.5 MG tablet, Take 12.5 mg by mouth daily., Disp: , Rfl:  .  SUMAtriptan (IMITREX) 100 MG tablet, Take 1 tablet (100 mg total) by mouth once as needed for migraine. May repeat in 2 hours if headache persists or recurs., Disp: 10 tablet, Rfl: 11  PAST MEDICAL HISTORY: No past medical history on file.  PAST SURGICAL HISTORY: No past surgical history on file.  FAMILY HISTORY: Family History  Problem Relation Age of Onset  . Multiple sclerosis Mother   . Heart disease Mother   . Lung cancer Father   . Seizures Brother   . Hypertension Brother   . High Cholesterol Brother     SOCIAL HISTORY:  Social History   Socioeconomic History  . Marital status: Unknown    Spouse name: Not on file  . Number of children: Not on file  . Years of education: Not on file  . Highest education level: Not on file  Occupational History  . Not on file  Social Needs  . Financial resource strain: Not on file  . Food insecurity    Worry: Not on file    Inability: Not on file  . Transportation needs    Medical: Not on file    Non-medical: Not on file  Tobacco Use  . Smoking status: Former Smoker  Substance and Sexual Activity  . Alcohol use: Yes    Alcohol/week: 0.0 standard drinks    Comment: social  . Drug use: No  . Sexual activity: Not on file  Lifestyle  . Physical activity    Days per week: Not on file    Minutes per session: Not on file  . Stress: Not on file  Relationships  . Social Herbalist on phone: Not on file    Gets together: Not on file    Attends religious service: Not on file    Active  member of club or organization: Not on file    Attends meetings of clubs or organizations: Not on file    Relationship status: Not on file  . Intimate partner violence    Fear of current or ex partner: Not on file    Emotionally abused: Not on file    Physically abused: Not on file    Forced sexual activity: Not on file  Other Topics Concern  . Not on file  Social History Narrative   Lives   Caffeine use:      PHYSICAL EXAM  Vitals:   11/22/18 1553  BP: 121/85  Pulse: 72  Temp: 98 F (36.7 C)  Weight: 216 lb (98 kg)  Height: 4\' 11"  (1.499 m)    Body mass index is 43.63 kg/m.   General: The patient is well-developed and well-nourished and in no acute distress.  The head is normocephalic and atraumatic.  The neck is nontender.  Range of motion is normal in the neck.  Eyes: On funduscopic examination, there was borderline papilledema OD (mild temporal blurring of disc margin) but I did not appreciate any papilledema OS.  Venous pulsations are seen.   Arteries appear normal.  There are no hemorrhages  Neurologic Exam  Mental status: The patient is alert and oriented x 3 at the time of the examination. The patient has apparent normal recent and remote memory, with an apparently normal attention span and concentration ability.   Speech is normal.  Cranial nerves: Extraocular movements are full. Pupils are equal, round, and reactive to light and accomodation.     There is good facial sensation to soft touch bilaterally.Facial strength is normal.  Trapezius and sternocleidomastoid strength is normal. No dysarthria is noted.  The tongue is midline, and the patient has symmetric elevation of the soft palate. No obvious hearing deficits are noted.  Motor:  Muscle bulk is normal.   Tone is normal. Strength is  5 / 5 in all 4 extremities.   Sensory: Sensory testing is intact to touch and vibration sensation in all 4 extremities.  Coordination: Cerebellar testing reveals good  finger-nose-finger and heel-to-shin bilaterally.  Gait and station: Station is normal.   Gait is normal. Tandem gait is mildly wide. Romberg is negative.   Reflexes: Deep tendon reflexes are symmetric and normal bilaterally.     DIAGNOSTIC DATA (LABS, IMAGING, TESTING) - I reviewed patient records, labs, notes, testing and imaging myself where available.      ASSESSMENT AND PLAN  1. Idiopathic intracranial hypertension   2. Papilledema   3. 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  On examination she had very mild papilledema OD.  1.   Continue actazolamide 500 mg twice daily  2.  Refer back to ophthalmology for visual field testing.  I advised her to do this periodically (about once a year) 3.  If she worsens despite being on acetazolamide, that I will refer her to neurosurgery to have a shunt placed. 4.  She snores but does not have excessive daytime sleepiness.  If this worsens we need to check a sleep study.  She is advised to try to lose weight and exercise as tolerated. 5.   If insomnia worsens consider anothjer sleep aid.  She will return to see me us in 12 months or sooner if there are new or worsening neurologic symptoms.  Kyndell Zeiser A. Epimenio FootSater, MD, PhD 11/22/2018, 5:20 PM Certified in Neurology, Clinical Neurophysiology, Sleep Medicine, Pain Medicine and Neuroimaging  [facility] [address], [address] [place], [place] [address] [phone]

## 2019-01-23 ENCOUNTER — Encounter: Payer: Self-pay | Admitting: Neurology

## 2019-01-23 ENCOUNTER — Other Ambulatory Visit: Payer: Self-pay

## 2019-01-23 ENCOUNTER — Ambulatory Visit: Payer: BC Managed Care – PPO | Admitting: Neurology

## 2019-01-23 VITALS — BP 138/78 | HR 72 | Temp 97.8°F | Ht 59.0 in | Wt 216.0 lb

## 2019-01-23 DIAGNOSIS — G932 Benign intracranial hypertension: Secondary | ICD-10-CM | POA: Diagnosis not present

## 2019-01-23 DIAGNOSIS — H4710 Unspecified papilledema: Secondary | ICD-10-CM

## 2019-01-23 DIAGNOSIS — G43009 Migraine without aura, not intractable, without status migrainosus: Secondary | ICD-10-CM

## 2019-01-23 DIAGNOSIS — H8110 Benign paroxysmal vertigo, unspecified ear: Secondary | ICD-10-CM | POA: Diagnosis not present

## 2019-01-23 MED ORDER — MECLIZINE HCL 25 MG PO TABS: 25.0000 mg | ORAL_TABLET | Freq: Two times a day (BID) | ORAL | 0 refills | Status: AC | PRN

## 2019-01-23 NOTE — Progress Notes (Signed)
[facility]  PATIENT: Cheryl Gomez DOB: 01/01/1961  REFERRING DOCTOR OR PCP:  Leretha PolElizabeth Harris, NP SOURCE: patient, records from Ms. Harris, Dr. Bradd BurnerHaria , and MRI report  _________________________________   HISTORICAL  CHIEF COMPLAINT:  Chief Complaint  Patient presents with  . Follow-up    RM 13.  Last seen 11/22/2018. Having worsening vertigo that started 11/22/2018. Doing PT for vertigo which has helps. Already finished twice weekly for 4 weeks. Now doing another 2 times weekly for 4 weeks. ([facility] in [place] PCP recommended she f/u with neuro about ongoing vertigo. Pt reports she has vertigo once weekly now.    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  Update 01/23/2019: She has had vertigo the past 2 months.   She was getting severe bouts of vertigo that were triggered by movements.    Rolling over or major position changes brought on spells. When a spell occurred they would last 1 minute.   During a spell balance was very poor and she would be unable to walk without holding on to something.   She was referred to vestibular therapy and feels she has done better the last 2 weeks.    They are also working on balance.     The past few days, the spells of vertigo have improved and she had only one spell yesterday plus mild off balance getting up to use the bathroom at night.     She did not note any change in hearing.      She is noting some trouble with seeing small things since getting a new pair of glasses.   Headaches are doing well.   She remains on Diamox.   She saw ophthalmology in February.        From 11/22/18: Cheryl Fannyeborah Gomez, at [facility] for neurologic consultation concerning her papilledema and idiopathic intracranial hypertension.  She is a 58 yo woman who was diagnosed with idiopathic intracranial hypertension after presenting with papilledema.    I had previously seen her > 3  years ago, last time in June 2017.    In January 2017, she had was found to have papilledema on optometric evaluation. At that time, she had mild blurry vision.    Lumbar puncture showed an opening pressure of 27 cm.      She was placed her on Diamox 500 mg po bid.   Papilledema improved and she did not report any more visual symptoms.  Since that time, she feels that her visual symptoms are unchanged.  She has noted some visual changes and recently got stronger glasses.   She did not get visual field testing at her last eye exam.    She sees [facility] in [place] (Dr. Bradd BurnerHaria) and also has seen [facility] in [place] for visual field testing in the past.    She also has a long history of migraine headaches.  Headaches are typical for migraine with photophobia, phonophobia and nausea.  Moving worsens the pain.   These improved after starting amitriptyline.  These now occur only one time a month and she takes Imitrex.  Currently, she feels her symptoms are stable.    She has occasional mild headaches with a pressure sensation.     Additionally when I first saw her she had had an MRI of the brain showing a few white matter foci though they were not typical for MS.  Lumbar puncture showed oligoclonal bands  that were also present in the serum (this is fairly common with idiopathic intracranial hypertension) and the IgG index was normal.  She has sleep onset and sleep maintenance insomnia.    Amitriptyline helped her sleep initially, but less so more recently.  Even a higher dose of 50 mg has not helped the insomnia enough.    She has woken up gasping for air a few times.   She thinks she snores some.    She is sometimes sleepy during the day.  EPWORTH SLEEPINESS SCALE  On a scale of 0 - 3 what is the chance of dozing:  Sitting and Reading:   0 Watching TV:    2 Sitting inactive in a public place: 0 Passenger in car for one hour: 0 Lying down to rest in the afternoon: 2 Sitting and talking to  someone: 0 Sitting quietly after lunch:  2 In a car, stopped in traffic:  0  Total (out of 24):    6/24 (normal)    REVIEW OF SYSTEMS: Constitutional: No fevers, chills, sweats, or change in appetite.    Insomnia.   Eyes: No visual changes, double vision, eye pain Ear, nose and throat: No hearing loss, ear pain, nasal congestion, sore throat Cardiovascular: No chest pain, palpitations Respiratory: No shortness of breath at rest or with exertion.   No wheezes GastrointestinaI: No nausea, vomiting, diarrhea, abdominal pain, fecal incontinence Genitourinary: She reports urinary frequency and urgency and nocturia.. Musculoskeletal: Some neck pain Integumentary: No rash, pruritus, skin lesions Neurological: as above Psychiatric: No depression at this time.  No anxiety Endocrine: No palpitations, diaphoresis, change in appetite, change in weigh or increased thirst Hematologic/Lymphatic: No anemia, purpura, petechiae. Allergic/Immunologic: No itchy/runny eyes, nasal congestion, recent allergic reactions, rashes  ALLERGIES: No Known Allergies  HOME MEDICATIONS:  Current Outpatient Medications:  .  acetaZOLAMIDE (DIAMOX) 500 MG capsule, Take 1 capsule (500 mg total) by mouth 2 (two) times daily., Disp: 180 capsule, Rfl: 3 .  amitriptyline (ELAVIL) 75 MG tablet, Take 75 mg by mouth at bedtime., Disp: , Rfl:  .  atenolol (TENORMIN) 25 MG tablet, Take 25 mg by mouth daily., Disp: , Rfl:  .  hydrochlorothiazide (HYDRODIURIL) 12.5 MG tablet, Take 12.5 mg by mouth daily., Disp: , Rfl:  .  meclizine (ANTIVERT) 25 MG tablet, Take 1 tablet (25 mg total) by mouth 2 (two) times daily as needed., Disp: 60 tablet, Rfl: 0 .  SUMAtriptan (IMITREX) 100 MG tablet, Take 1 tablet (100 mg total) by mouth once as needed for migraine. May repeat in 2 hours if headache persists or recurs., Disp: 10 tablet, Rfl: 11  PAST MEDICAL HISTORY: History reviewed. No pertinent past medical history.  PAST SURGICAL  HISTORY: History reviewed. No pertinent surgical history.  FAMILY HISTORY: Family History  Problem Relation Age of Onset  . Multiple sclerosis Mother   . Heart disease Mother   . Lung cancer Father   . Seizures Brother   . Hypertension Brother   . High Cholesterol Brother     SOCIAL HISTORY:  Social History   Socioeconomic History  . Marital status: Unknown    Spouse name: Not on file  . Number of children: Not on file  . Years of education: Not on file  . Highest education level: Not on file  Occupational History  . Not on file  Social Needs  . Financial resource strain: Not on file  . Food insecurity    Worry: Not on file    Inability:  Not on file  . Transportation needs    Medical: Not on file    Non-medical: Not on file  Tobacco Use  . Smoking status: Former Games developer  . Smokeless tobacco: Never Used  Substance and Sexual Activity  . Alcohol use: Yes    Alcohol/week: 0.0 standard drinks    Comment: social  . Drug use: No  . Sexual activity: Not on file  Lifestyle  . Physical activity    Days per week: Not on file    Minutes per session: Not on file  . Stress: Not on file  Relationships  . Social Musician on phone: Not on file    Gets together: Not on file    Attends religious service: Not on file    Active member of club or organization: Not on file    Attends meetings of clubs or organizations: Not on file    Relationship status: Not on file  . Intimate partner violence    Fear of current or ex partner: Not on file    Emotionally abused: Not on file    Physically abused: Not on file    Forced sexual activity: Not on file  Other Topics Concern  . Not on file  Social History Narrative   Lives   Caffeine use:      PHYSICAL EXAM  Vitals:   01/23/19 1514  BP: 138/78  Pulse: 72  Temp: 97.8 F (36.6 C)  SpO2: 96%  Weight: 216 lb (98 kg)  Height: 4\' 11"  (1.499 m)    Body mass index is 43.63 kg/m.   General: The patient is  well-developed and well-nourished and in no acute distress.  The head is normocephalic and atraumatic.  The neck is nontender.  Range of motion is normal in the neck.  Ear canals were clear.  Eyes: On funduscopic examination, there was borderline papilledema OD (mild temporal blurring of disc margin) but I did not appreciate any papilledema OS.  Venous pulsations are seen.   Arteries appear normal.  There are no hemorrhages  Neurologic Exam  Mental status: The patient is alert and oriented x 3 at the time of the examination. The patient has apparent normal recent and remote memory, with an apparently normal attention span and concentration ability.   Speech is normal.  Cranial nerves: Extraocular movements are full. Pupils are equal, round, and reactive to light and accomodation.     There is good facial sensation to soft touch bilaterally.Facial strength is normal.  Trapezius and sternocleidomastoid strength is normal. No dysarthria is noted.  The tongue is midline, and the patient has symmetric elevation of the soft palate. No obvious hearing deficits are noted.  Motor:  Muscle bulk is normal.   Tone is normal. Strength is  5 / 5 in all 4 extremities.   Sensory: Sensory testing is intact to touch and vibration sensation in all 4 extremities.  Coordination: Cerebellar testing reveals good finger-nose-finger and heel-to-shin bilaterally.  Gait and station: Station is normal.   Gait is normal. Tandem gait is mildly wide. Romberg is negative.   Reflexes: Deep tendon reflexes are symmetric and normal bilaterally.   Other:   Dix-Hallpike maneuvers to either side did not evoke any nystagmus or vertigo.      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   Continue actazolamide 500 mg twice daily for idiopathic  intracranial hypertension.    Follow-up  with ophthalmology at least once a year. 2.   Her benign positional vertigo is improving.  She will complete vestibular therapy.  We discussed that if symptoms recur she should contact us or her physical therapist for an Epley maneuver.   3.  If she worsens despite being on acetazolamide, that I will refer her to neurosurgery to have a shunt placed. 4.  Try to lose weight.  In the past we discussed a sleep study if she gets excessive daytime sleepiness.  She has snoring. 5.   If insomnia worsens consider anothjer sleep aid.  She will return to see me us in 12 months or sooner if there are new or worsening neurologic symptoms.  Neosha Switalski A. Epimenio FootSater, MD, PhD 01/23/2019, 6:09 PM Certified in Neurology, Clinical Neurophysiology, Sleep Medicine, Pain Medicine and Neuroimaging  [facility] [address], [address] [place], [place] [address] [phone]

## 2019-11-29 ENCOUNTER — Ambulatory Visit: Payer: BC Managed Care – PPO | Admitting: Neurology

## 2019-11-29 ENCOUNTER — Encounter: Payer: Self-pay | Admitting: Neurology

## 2020-07-28 ENCOUNTER — Telehealth: Payer: Self-pay | Admitting: Neurology

## 2020-07-28 NOTE — Telephone Encounter (Signed)
Pt. states that she was seen by her PCP today & that she told her to contact her neurologist for another CAT scan. Please advise.

## 2020-07-28 NOTE — Telephone Encounter (Signed)
Called pt. She has not been seen since 01/23/2019. She would need to be seen by MD first to be evaluated/discuss. Scheduled appt for 08/05/20 at 1:00pm. Asked she check in prior, wear mask, bring updated insurance cards and med list with her. She verbalized understanding and appreciation.

## 2020-08-05 ENCOUNTER — Ambulatory Visit (INDEPENDENT_AMBULATORY_CARE_PROVIDER_SITE_OTHER): Payer: BLUE CROSS/BLUE SHIELD | Admitting: Neurology

## 2020-08-05 ENCOUNTER — Encounter: Payer: Self-pay | Admitting: Neurology

## 2020-08-05 VITALS — BP 145/94 | HR 76 | Ht 59.0 in | Wt 199.0 lb

## 2020-08-05 DIAGNOSIS — G932 Benign intracranial hypertension: Secondary | ICD-10-CM

## 2020-08-05 DIAGNOSIS — R0683 Snoring: Secondary | ICD-10-CM | POA: Diagnosis not present

## 2020-08-05 DIAGNOSIS — H8110 Benign paroxysmal vertigo, unspecified ear: Secondary | ICD-10-CM | POA: Diagnosis not present

## 2020-08-05 DIAGNOSIS — G43009 Migraine without aura, not intractable, without status migrainosus: Secondary | ICD-10-CM

## 2020-08-05 MED ORDER — ATENOLOL 50 MG PO TABS: 50.0000 mg | ORAL_TABLET | Freq: Every day | ORAL | 3 refills | Status: DC

## 2020-08-05 MED ORDER — ACETAZOLAMIDE 250 MG PO TABS: 250.0000 mg | ORAL_TABLET | Freq: Two times a day (BID) | ORAL | 3 refills | Status: AC

## 2020-08-05 NOTE — Progress Notes (Signed)
[facility]  PATIENT: Cheryl Gomez DOB: 04-02-1961  REFERRING DOCTOR OR PCP:  Leretha Pol, NP SOURCE: patient, records from Ms. Harris, Dr. Bradd Burner , and MRI report  _________________________________   HISTORICAL  CHIEF COMPLAINT:  Chief Complaint  Patient presents with  . Follow-up    RM 12. Last seen 01/23/2019. Follow up on migraine/IIH/vertigo. Reports she saw PCP last week for continued migraines/cold spells/dizziness. Having about 1-2 migraine per month/gets nauseous. Pain located in front and back of head. Cold spells usually occur at night. Has worsened in the last two weeks. Is dehydrated throughout the night. Spells usually last about 1 hr.    HISTORY OF PRESENT ILLNESS:  She is a 60 y.o. woman with IIH, papilledema and vertigo.  Update 08/05/2020: She reports more headaches recently.   She ws placed on Diamox for intracranial hypertension diagnosed after presenting with papilledema and having an opening pressure of 270 mm.   Her headaches improved after the LP.  Her last ophthalmology appt was in 2021 and she was not told there was any papilledema at that time.    She  Is on Diamox 250 mg po bid.   She is also on amitriptyline, currently on 100 mg, with some benefit.      Headaches are often present when she wakes up.  They are associated with pounding pain, lightheadedness, nausea but not vomiting, photophobia, phonophobia.  Movements worsen the pain.  Imitrex has helped.   In the last 30 days she has had 4 headaches, most successfully treated with sumatriptan.     Vertigo is doing better.   She has meclizine.     She snores but does not report excessive daytime sleepiness.  HA and intracranial hypertension history: In January 2017, she had was found to have papilledema on optometric evaluation. At that time, she had mild blurry vision.    Lumbar puncture showed an opening pressure of 27 cm.      She was placed her on Diamox 500 mg po bid.    Papilledema improved and she did not report any more visual symptoms.  Since that time, she feels that her visual symptoms are unchanged.  She has noted some visual changes and recently got stronger glasses.   She did not get visual field testing at her last eye exam.    She sees [facility] in [place] (Dr. Bradd Burner) and also has seen [facility] in [place] for visual field testing in the past.    LP 2017 showed OP = 270 (elevated) and she was placed on Diamox with benefit.    She also has a long history of migraine headaches.  Headaches are typical for migraine with photophobia, phonophobia and nausea.  Moving worsens the pain.   These improved after starting amitriptyline.  These now occur only one time a month and she takes Imitrex.  Currently, she feels her symptoms are stable.    She has occasional mild headaches with a pressure sensation.   Topamax was poorly tolerated (aggressive, irritaable)  Additionally when I first saw her she had had an MRI of the brain showing a few white matter foci though they were not typical for MS.  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    REVIEW OF SYSTEMS: Constitutional: No fevers, chills, sweats, or change in appetite.    Insomnia.   Eyes: No visual changes, double  vision, eye pain Ear, nose and throat: No hearing loss, ear pain, nasal congestion, sore throat Cardiovascular: No chest pain, palpitations Respiratory: No shortness of breath at rest or with exertion.   No wheezes GastrointestinaI: No nausea, vomiting, diarrhea, abdominal pain, fecal incontinence Genitourinary: She reports urinary frequency and urgency and nocturia.. Musculoskeletal: Some neck pain Integumentary: No rash, pruritus, skin lesions Neurological: as above Psychiatric: No depression at this time.  No anxiety Endocrine: No palpitations,  diaphoresis, change in appetite, change in weigh or increased thirst Hematologic/Lymphatic: No anemia, purpura, petechiae. Allergic/Immunologic: No itchy/runny eyes, nasal congestion, recent allergic reactions, rashes  ALLERGIES: No Known Allergies  HOME MEDICATIONS:  Current Outpatient Medications:  .  amitriptyline (ELAVIL) 100 MG tablet, Take 100 mg by mouth at bedtime., Disp: , Rfl:  .  atorvastatin (LIPITOR) 20 MG tablet, Take 20 mg by mouth daily., Disp: , Rfl:  .  meclizine (ANTIVERT) 25 MG tablet, Take 1 tablet (25 mg total) by mouth 2 (two) times daily as needed., Disp: 60 tablet, Rfl: 0 .  omeprazole (PRILOSEC) 40 MG capsule, Take 40 mg by mouth daily., Disp: , Rfl:  .  SUMAtriptan (IMITREX) 100 MG tablet, Take 1 tablet (100 mg total) by mouth once as needed for migraine. May repeat in 2 hours if headache persists or recurs., Disp: 10 tablet, Rfl: 11 .  acetaZOLAMIDE (DIAMOX) 250 MG tablet, Take 1 tablet (250 mg total) by mouth 2 (two) times daily., Disp: 180 tablet, Rfl: 3 .  atenolol (TENORMIN) 50 MG tablet, Take 1 tablet (50 mg total) by mouth daily., Disp: 90 tablet, Rfl: 3  PAST MEDICAL HISTORY: No past medical history on file.  PAST SURGICAL HISTORY: No past surgical history on file.  FAMILY HISTORY: Family History  Problem Relation Age of Onset  . Multiple sclerosis Mother   . Heart disease Mother   . Lung cancer Father   . Seizures Brother   . Hypertension Brother   . High Cholesterol Brother     SOCIAL HISTORY:  Social History   Socioeconomic History  . Marital status: Unknown    Spouse name: Not on file  . Number of children: Not on file  . Years of education: Not on file  . Highest education level: Not on file  Occupational History  . Not on file  Tobacco Use  . Smoking status: Former Games developer  . Smokeless tobacco: Never Used  Substance and Sexual Activity  . Alcohol use: Yes    Alcohol/week: 0.0 standard drinks    Comment: social  . Drug use:  No  . Sexual activity: Not on file  Other Topics Concern  . Not on file  Social History Narrative   Lives   Caffeine use:    Social Determinants of Health   Financial Resource Strain: Not on file  Food Insecurity: Not on file  Transportation Needs: Not on file  Physical Activity: Not on file  Stress: Not on file  Social Connections: Not on file  Intimate Partner Violence: Not on file     PHYSICAL EXAM  Vitals:   08/05/20 1223  BP: (!) 145/94  Pulse: 76  Weight: 199 lb (90.3 kg)  Height: 4\' 11"  (1.499 m)    Body mass index is 40.19 kg/m.   General: The patient is well-developed and well-nourished and in no acute distress.  The head is normocephalic and atraumatic.  The neck is nontender.  Range of motion is normal in the neck.  Ear canals were clear.  Eyes:  On funduscopic examination, there is no papilledema.  Venous pulsations are seen.   Arteries appear normal.  There are no hemorrhages  Neurologic Exam  Mental status: The patient is alert and oriented x 3 at the time of the examination. The patient has apparent normal recent and remote memory, with an apparently normal attention span and concentration ability.   Speech is normal.  Cranial nerves: Extraocular movements are full. Pupils are equal, round, and reactive to light and accomodation.    Normal facial strength.  No obvious hearing deficits are noted.  Motor:  Muscle bulk is normal.   Tone is normal. Strength is  5 / 5 in all 4 extremities.   Sensory: Sensory testing is intact to touch and vibration sensation in all 4 extremities.  Coordination: Cerebellar testing reveals good finger-nose-finger and heel-to-shin bilaterally.  Gait and station: Station is normal.   Gait is normal. Tandem gait is mildly wide. Romberg is negative.   Reflexes: Deep tendon reflexes are symmetric and normal bilaterally.       ASSESSMENT AND PLAN    Idiopathic intracranial hypertension  Common migraine without  intractability  Benign paroxysmal positional vertigo, unspecified laterality  Snoring   1.   Continue actazolamide 250 mg twice daily for idiopathic intracranial hypertension.    Follow-up with ophthalmology at least once a year. 2.   For migraine continue amitriptyline.  We will increase atenolol to 50 mg po qd.   If no better in a few weeks she will call (consider Keppra).   If worsening,  consider MRi 3.  Try to lose weight.  In the past we discussed a sleep study if she gets excessive daytime sleepiness.  She has snoring. 4.  She will return to see me [place] in 12 months or sooner if there are new or worsening neurologic symptoms.  Richard A. Epimenio Foot, MD, PhD 08/05/2020, 1:38 PM Certified in Neurology, Clinical Neurophysiology, Sleep Medicine, Pain Medicine and Neuroimaging  [facility] [address] [place], [place] [address] [phone]

## 2020-09-16 ENCOUNTER — Telehealth: Payer: Self-pay | Admitting: Neurology

## 2020-09-16 MED ORDER — SUMATRIPTAN SUCCINATE 100 MG PO TABS: 100.0000 mg | ORAL_TABLET | Freq: Once | ORAL | 11 refills | Status: AC | PRN

## 2020-09-16 MED ORDER — LEVETIRACETAM 750 MG PO TABS: 750.0000 mg | ORAL_TABLET | Freq: Two times a day (BID) | ORAL | 3 refills | Status: DC

## 2020-09-16 NOTE — Telephone Encounter (Addendum)
Per Dr. Epimenio Foot, he would like to add Keppra 750mg  to take twice a day. If not better in a month, she should let know. I called pt and relayed this recommendation. She is agreeable to this plan. I e-scribed rx to [facility].

## 2020-09-16 NOTE — Telephone Encounter (Signed)
Pt called wanting to speak to the RN regarding the headaches she is still experiencing. Pt is also needing a refill on her SUMAtriptan (IMITREX) 100 MG tablet and her acetaZOLAMIDE (DIAMOX) 250 MG tablet sent in to the [facility].

## 2020-09-16 NOTE — Telephone Encounter (Signed)
Called pt back. She is having at least 2 migraines/week. Migraines started worsening in the last 1.5 months. Denies having seasonal allergies. Denies starting any other new meds recently. She gets nauseous and has vision changes w/ migraines. Verified she is taking amitriptyline, acetazolamide, atenolol as prescribed.   Reviewed pt chart, Dr. Epimenio Foot mentioned at last visit trying keppra or doing MRI if migraines are worse

## 2020-09-18 ENCOUNTER — Telehealth: Payer: Self-pay | Admitting: Neurology

## 2020-09-18 NOTE — Telephone Encounter (Signed)
Pt's boyfriend, Toribio Harbour (not on DPR) called, took first one of  levETIRAcetam (KEPPRA) 750 MG tablet and she felt dizzy yesterday. This morning her headache is gone, but she is walking around like she is drunk. She could not go to work. She would like a call from the nurse.  I informed Mr. Kennyth Arnold he was not on her DPR and could not speak with him. He stated, when she calls she can speak with her.

## 2020-09-18 NOTE — Telephone Encounter (Signed)
Dr. Epimenio Foot- please advise about disability, thank you

## 2020-09-18 NOTE — Telephone Encounter (Signed)
Called and spoke with pt. Advised per Dr. Epimenio Foot that he recommends she take keppra 750mg  tablet as follows: 1/2 tablet twice daily. Hoping she will tolerate this dose better. If she tolerates well and it controls her sx well, she can stay at this dose. If she does not tolerate well or if she feels dose not controlling sx, she should let know. She verbalized understanding.   She advised she is applying for disability. She has not spoken about this with Dr. [place]. Advised I will have to send him message to see if he feels she qualifies for disability or not. I will call her early next week at the latest to let her know what he says.

## 2020-09-18 NOTE — Telephone Encounter (Signed)
Tried calling pt at [phone]. LVM for pt.

## 2020-09-18 NOTE — Telephone Encounter (Signed)
Pt returned phone call, would like a call back.  

## 2020-09-20 NOTE — Telephone Encounter (Signed)
I I don't know whether she will qualify but our office will forward records when the state requests them

## 2020-09-22 NOTE — Telephone Encounter (Signed)
Not able to reach pt. If patient call back we have  not received a request from disability determination. Once we received it we will fax the medical records to [facility].

## 2020-09-22 NOTE — Telephone Encounter (Signed)
Cheryl Gomez- can you let pt know about disability? Thank you

## 2020-09-22 NOTE — Telephone Encounter (Signed)
Pt returned phone call, relayed previous telephone note. Pt verbalized understood.

## 2021-06-23 ENCOUNTER — Telehealth: Payer: Self-pay | Admitting: Neurology

## 2021-06-23 MED ORDER — AMITRIPTYLINE HCL 100 MG PO TABS: 100.0000 mg | ORAL_TABLET | Freq: Every day | ORAL | 2 refills | Status: AC

## 2021-06-23 NOTE — Telephone Encounter (Signed)
E-scribed refill 

## 2021-06-23 NOTE — Telephone Encounter (Signed)
Pt request refill for amitriptyline (ELAVIL) 100 MG tablet at [facility]

## 2021-07-30 ENCOUNTER — Other Ambulatory Visit: Payer: Self-pay | Admitting: Neurology

## 2021-08-05 ENCOUNTER — Encounter: Payer: Self-pay | Admitting: Family Medicine

## 2021-08-05 ENCOUNTER — Ambulatory Visit: Payer: BLUE CROSS/BLUE SHIELD | Admitting: Family Medicine

## 2021-08-05 NOTE — Progress Notes (Deleted)
? ? ?No chief complaint on file. ? ? ? ?HISTORY OF PRESENT ILLNESS: ? ?08/05/21 ALL:  ?Cheryl Gomez is a 61 y.o. female here today for follow up for IIH and migraines. She continues amitriptyline 250mg  BID, atenolol 50mg  BID, amitriptyline 100mg  QHS and levetiracetam 750mg  BID.  ? ? ?HISTORY (copied from [name] previous note) ? 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 ?  ?Update 08/05/2020: ?She reports more headaches recently.   She ws placed on Diamox for intracranial hypertension diagnosed after presenting with papilledema and having an opening pressure of 270 mm.   Her headaches improved after the LP.  Her last ophthalmology appt was in 2021 and she was not told there was any papilledema at that time.   ?  ?She  Is on Diamox 250 mg po bid.   She is also on amitriptyline, currently on 100 mg, with some benefit.     ?  ?Headaches are often present when she wakes up.  They are associated with pounding pain, lightheadedness, nausea but not vomiting, photophobia, phonophobia.  Movements worsen the pain.  Imitrex has helped.   In the last 30 days she has had 4 headaches, most successfully treated with sumatriptan.    ?  ?Vertigo is doing better.   She has meclizine.    ?  ?She snores but does not report excessive daytime sleepiness. ?  ?HA and intracranial hypertension history: ?In January 2017, she had was found to have papilledema on optometric evaluation. At that time, she had mild blurry vision.    Lumbar puncture showed an opening pressure of 27 cm.      She was placed her on Diamox 500 mg po bid.   Papilledema improved and she did not report any more visual symptoms.  Since that time, she feels that her visual symptoms are unchanged.  She has noted some visual changes and recently got stronger glasses.   She did not get visual field testing at her last eye exam.    She sees [facility] in [place] (Dr. Trinna Post) and also has seen [facility] in [place] for visual field testing in the  past.   ?  ?LP 2017 showed OP = 270 (elevated) and she was placed on Diamox with benefit.   ?  ?She also has a long history of migraine headaches.  Headaches are typical for migraine with photophobia, phonophobia and nausea.  Moving worsens the pain.   These improved after starting amitriptyline.  These now occur only one time a month and she takes Imitrex.  Currently, she feels her symptoms are stable.    She has occasional mild headaches with a pressure sensation.   Topamax was poorly tolerated (aggressive, irritaable) ?  ?Additionally when I first saw her she had had an MRI of the brain showing a few white matter foci though they were not typical for MS.  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 ? ? ?REVIEW OF SYSTEMS: Out of a complete 14 system review of symptoms, the patient complains only of the following symptoms, and all other reviewed systems are negative. ? ? ?ALLERGIES: ?No Known Allergies ? ? ?HOME MEDICATIONS: ?Outpatient Medications Prior to Visit  ?Medication Sig Dispense Refill  ? acetaZOLAMIDE (DIAMOX) 250 MG tablet Take 1 tablet (250 mg total) by mouth 2 (two) times daily. 180 tablet 3  ? amitriptyline (ELAVIL) 100  MG tablet Take 1 tablet (100 mg total) by mouth at bedtime. 30 tablet 2  ? atenolol (TENORMIN) 50 MG tablet TAKE 1 TABLET (50 MG TOTAL) BY MOUTH DAILY. 90 tablet 0  ? atorvastatin (LIPITOR) 20 MG tablet Take 20 mg by mouth daily.    ? levETIRAcetam (KEPPRA) 750 MG tablet Take 1 tablet (750 mg total) by mouth 2 (two) times daily. 120 tablet 3  ? meclizine (ANTIVERT) 25 MG tablet Take 1 tablet (25 mg total) by mouth 2 (two) times daily as needed. 60 tablet 0  ? omeprazole (PRILOSEC) 40 MG capsule Take 40 mg by mouth daily.    ? SUMAtriptan (IMITREX) 100 MG tablet Take 1 tablet (100 mg total) by mouth once as needed for migraine. May repeat once in 2 hours if  headache persists or recurs. No more than 2 tabs in 24 hours or 2-3 doses in a week 10 tablet 11  ? ?No facility-administered medications prior to visit.  ? ? ? ?PAST MEDICAL HISTORY: ?No past medical history on file. ? ? ?PAST SURGICAL HISTORY: ?No past surgical history on file. ? ? ?FAMILY HISTORY: ?Family History  ?Problem Relation Age of Onset  ? Multiple sclerosis Mother   ? Heart disease Mother   ? Lung cancer Father   ? Seizures Brother   ? Hypertension Brother   ? High Cholesterol Brother   ? ? ? ?SOCIAL HISTORY: ?Social History  ? ?Socioeconomic History  ? Marital status: Unknown  ?  Spouse name: Not on file  ? Number of children: Not on file  ? Years of education: Not on file  ? Highest education level: Not on file  ?Occupational History  ? Not on file  ?Tobacco Use  ? Smoking status: Former  ? Smokeless tobacco: Never  ?Substance and Sexual Activity  ? Alcohol use: Yes  ?  Alcohol/week: 0.0 standard drinks  ?  Comment: social  ? Drug use: No  ? Sexual activity: Not on file  ?Other Topics Concern  ? Not on file  ?Social History Narrative  ? Lives  ? Caffeine use:   ? ?Social Determinants of Health  ? ?Financial Resource Strain: Not on file  ?Food Insecurity: Not on file  ?Transportation Needs: Not on file  ?Physical Activity: Not on file  ?Stress: Not on file  ?Social Connections: Not on file  ?Intimate Partner Violence: Not on file  ? ? ? ?PHYSICAL EXAM ? ?There were no vitals filed for this visit. ?There is no height or weight on file to calculate BMI. ? ?Generalized: Well developed, in no acute distress ? ?Cardiology: normal rate and rhythm, no murmur auscultated  ?Respiratory: clear to auscultation bilaterally   ? ?Neurological examination  ?Mentation: Alert oriented to time, place, history taking. Follows all commands speech and language fluent ?Cranial nerve II-XII: Pupils were equal round reactive to light. Extraocular movements were full, visual field were full on confrontational test. Facial  sensation and strength were normal. Uvula tongue midline. Head turning and shoulder shrug  were normal and symmetric. ?Motor: The motor testing reveals 5 over 5 strength of all 4 extremities. Good symmetric motor tone is noted throughout.  ?Sensory: Sensory testing is intact to soft touch on all 4 extremities. No evidence of extinction is noted.  ?Coordination: Cerebellar testing reveals good finger-nose-finger and heel-to-shin bilaterally.  ?Gait and station: Gait is normal. Tandem gait is normal. Romberg is negative. No drift is seen.  ?Reflexes: Deep tendon reflexes are symmetric and normal bilaterally.  ? ? ?  DIAGNOSTIC DATA (LABS, IMAGING, TESTING) ?- I reviewed patient records, labs, notes, testing and imaging myself where available. ? ?No results found for: WBC, HGB, HCT, MCV, PLT ?No results found for: NA, K, CL, CO2, GLUCOSE, BUN, CREATININE, CALCIUM, PROT, ALBUMIN, AST, ALT, ALKPHOS, BILITOT, GFRNONAA, GFRAA ?No results found for: CHOL, HDL, LDLCALC, LDLDIRECT, TRIG, CHOLHDL ?No results found for: HGBA1C ?No results found for: VITAMINB12 ?No results found for: TSH ? ?No flowsheet data found. ? ? ?No flowsheet data found. ? ? ?ASSESSMENT AND PLAN ? ?61 y.o. year old female  has no past medical history on file. here with  ? ? ?No diagnosis found. ? ? ?No orders of the defined types were placed in this encounter. ? ? ? ?No orders of the defined types were placed in this encounter. ? ? ? ? ?Debbora Presto, MSN, FNP-C 08/05/2021, 7:26 AM ? ?[facility] ?[address], [address] ?[place], [place] [address] ?[phone] ?

## 2021-08-05 NOTE — Patient Instructions (Incomplete)

## 2021-11-09 ENCOUNTER — Other Ambulatory Visit: Payer: Self-pay | Admitting: Neurology
